# Patient Record
Sex: Female | Born: 1970 | Race: White | Hispanic: No | Marital: Married | Smoking: Never smoker
Health system: Southern US, Community
[De-identification: ages and names within clinical notes are randomized; demographics above are authoritative.]

---

## 2017-01-12 ENCOUNTER — Encounter (HOSPITAL_BASED_OUTPATIENT_CLINIC_OR_DEPARTMENT_OTHER): Payer: Self-pay | Admitting: *Deleted

## 2017-01-12 ENCOUNTER — Emergency Department (HOSPITAL_BASED_OUTPATIENT_CLINIC_OR_DEPARTMENT_OTHER)
Admission: EM | Admit: 2017-01-12 | Discharge: 2017-01-13 | Disposition: A | Discharge status: Home / Self Care | Payer: No Typology Code available for payment source | Attending: Emergency Medicine | Admitting: Emergency Medicine

## 2017-01-12 ENCOUNTER — Other Ambulatory Visit: Payer: Self-pay

## 2017-01-12 ENCOUNTER — Emergency Department (HOSPITAL_BASED_OUTPATIENT_CLINIC_OR_DEPARTMENT_OTHER): Payer: No Typology Code available for payment source

## 2017-01-12 DIAGNOSIS — S46812A Strain of other muscles, fascia and tendons at shoulder and upper arm level, left arm, initial encounter: Secondary | ICD-10-CM | POA: Diagnosis not present

## 2017-01-12 DIAGNOSIS — R55 Syncope and collapse: Secondary | ICD-10-CM | POA: Diagnosis not present

## 2017-01-12 DIAGNOSIS — Y9389 Activity, other specified: Secondary | ICD-10-CM | POA: Diagnosis not present

## 2017-01-12 DIAGNOSIS — Y9285 Railroad track as the place of occurrence of the external cause: Secondary | ICD-10-CM | POA: Insufficient documentation

## 2017-01-12 DIAGNOSIS — Y999 Unspecified external cause status: Secondary | ICD-10-CM | POA: Diagnosis not present

## 2017-01-12 DIAGNOSIS — M79605 Pain in left leg: Secondary | ICD-10-CM | POA: Diagnosis not present

## 2017-01-12 MED ORDER — MECLIZINE HCL 25 MG PO TABS
25.0000 mg | ORAL_TABLET | Freq: Once | ORAL | Status: AC
  Administered 2017-01-12: 25 mg via ORAL
  Filled 2017-01-12: qty 1

## 2017-01-12 MED ORDER — IOPAMIDOL (ISOVUE-370) INJECTION 76%
100.0000 mL | Freq: Once | INTRAVENOUS | Status: AC | PRN
  Administered 2017-01-12: 100 mL via INTRAVENOUS
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
## 2017-01-12 NOTE — Discharge Instructions (Signed)
No driving until cleared by your family doc or neurology.  Return for sudden worsening symptoms.   Take 4 over the counter ibuprofen tablets 3 times a day or 2 over-the-counter naproxen tablets twice a day for pain. Also take tylenol 1000mg (2 extra strength) four times a day.

## 2017-01-13 NOTE — ED Notes (Signed)
Pt verbalizes understanding of dc instructions and denies any further needs at his time 
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
